# Patient Record
Sex: Female | Born: 1965 | Race: White | Hispanic: No | Marital: Married | Smoking: Current some day smoker
Health system: Southern US, Community
[De-identification: ages and names within clinical notes are randomized; demographics above are authoritative.]

## PROBLEM LIST (undated history)

## (undated) DIAGNOSIS — I10 Essential (primary) hypertension: Secondary | ICD-10-CM

## (undated) DIAGNOSIS — M47812 Spondylosis without myelopathy or radiculopathy, cervical region: Secondary | ICD-10-CM

## (undated) HISTORY — DX: Spondylosis without myelopathy or radiculopathy, cervical region: M47.812

## (undated) HISTORY — PX: WISDOM TOOTH EXTRACTION: SHX21

## (undated) HISTORY — PX: DILATION AND CURETTAGE, DIAGNOSTIC / THERAPEUTIC: SUR384

## (undated) HISTORY — PX: BREAST BIOPSY: SHX20

## (undated) HISTORY — DX: Essential (primary) hypertension: I10

---
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
## 1989-10-21 HISTORY — PX: SPINAL FUSION: SHX223

## 2012-03-25 HISTORY — PX: LAPAROSCOPIC GASTRIC BANDING WITH HIATAL HERNIA REPAIR: SHX6351

## 2012-11-22 HISTORY — PX: CYSTOPLASTY: SHX475

## 2018-08-11 ENCOUNTER — Encounter: Payer: Self-pay | Admitting: Orthopaedic Surgery

## 2018-08-11 HISTORY — PX: HIP ARTHROPLASTY: SHX981

## 2020-03-29 ENCOUNTER — Ambulatory Visit: Payer: No Typology Code available for payment source | Admitting: Orthopaedic Surgery

## 2020-03-29 ENCOUNTER — Other Ambulatory Visit: Payer: Self-pay

## 2020-04-04 ENCOUNTER — Other Ambulatory Visit: Payer: Self-pay

## 2020-04-04 ENCOUNTER — Encounter: Payer: Self-pay | Admitting: Orthopaedic Surgery

## 2020-04-04 ENCOUNTER — Ambulatory Visit (INDEPENDENT_AMBULATORY_CARE_PROVIDER_SITE_OTHER): Payer: No Typology Code available for payment source | Admitting: Orthopaedic Surgery

## 2020-04-04 ENCOUNTER — Ambulatory Visit: Payer: No Typology Code available for payment source

## 2020-04-04 VITALS — BP 120/71 | HR 74 | Ht 61.0 in | Wt 144.0 lb

## 2020-04-04 DIAGNOSIS — M25552 Pain in left hip: Secondary | ICD-10-CM | POA: Diagnosis not present

## 2020-04-04 DIAGNOSIS — Z96642 Presence of left artificial hip joint: Secondary | ICD-10-CM | POA: Diagnosis not present

## 2020-04-04 DIAGNOSIS — G8929 Other chronic pain: Secondary | ICD-10-CM

## 2020-04-04 MED ORDER — IBUPROFEN 800 MG PO TABS
800.0000 mg | ORAL_TABLET | Freq: Three times a day (TID) | ORAL | 5 refills | Status: AC | PRN
Start: 2020-04-04 — End: ?

## 2020-04-04 MED ORDER — HYDROCODONE-ACETAMINOPHEN 5-325 MG PO TABS: ORAL_TABLET | ORAL | 0 refills | Status: DC
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
## 2020-04-26 ENCOUNTER — Other Ambulatory Visit: Payer: Self-pay

## 2020-04-26 ENCOUNTER — Ambulatory Visit: Payer: No Typology Code available for payment source | Admitting: Orthopaedic Surgery

## 2020-04-26 ENCOUNTER — Encounter (HOSPITAL_COMMUNITY): Payer: Self-pay

## 2020-04-26 ENCOUNTER — Ambulatory Visit (HOSPITAL_COMMUNITY)
Admission: RE | Admit: 2020-04-26 | Discharge: 2020-04-26 | Disposition: A | Discharge status: Home / Self Care | Payer: No Typology Code available for payment source | Source: Ambulatory Visit | Attending: Orthopaedic Surgery | Admitting: Orthopaedic Surgery

## 2020-04-26 ENCOUNTER — Other Ambulatory Visit (HOSPITAL_COMMUNITY)
Admission: RE | Admit: 2020-04-26 | Discharge: 2020-04-26 | Disposition: A | Discharge status: Home / Self Care | Payer: No Typology Code available for payment source | Source: Ambulatory Visit | Attending: Orthopaedic Surgery | Admitting: Orthopaedic Surgery

## 2020-04-26 DIAGNOSIS — Z96642 Presence of left artificial hip joint: Secondary | ICD-10-CM | POA: Diagnosis not present

## 2020-04-26 LAB — CBC WITH DIFFERENTIAL/PLATELET
Abs Immature Granulocytes: 0.01 10*3/uL (ref 0.00–0.07)
Basophils Absolute: 0 10*3/uL (ref 0.0–0.1)
Basophils Relative: 1 %
Eosinophils Absolute: 0.1 10*3/uL (ref 0.0–0.5)
Eosinophils Relative: 1 %
HCT: 40.2 % (ref 36.0–46.0)
Hemoglobin: 13.6 g/dL (ref 12.0–15.0)
Immature Granulocytes: 0 %
Lymphocytes Relative: 27 %
Lymphs Abs: 1.7 10*3/uL (ref 0.7–4.0)
MCH: 31.1 pg (ref 26.0–34.0)
MCHC: 33.8 g/dL (ref 30.0–36.0)
MCV: 91.8 fL (ref 80.0–100.0)
Monocytes Absolute: 0.5 10*3/uL (ref 0.1–1.0)
Monocytes Relative: 7 %
Neutro Abs: 4.2 10*3/uL (ref 1.7–7.7)
Neutrophils Relative %: 64 %
Platelets: 275 10*3/uL (ref 150–400)
RBC: 4.38 MIL/uL (ref 3.87–5.11)
RDW: 11.9 % (ref 11.5–15.5)
WBC: 6.5 10*3/uL (ref 4.0–10.5)
nRBC: 0 % (ref 0.0–0.2)

## 2020-04-26 LAB — C-REACTIVE PROTEIN: CRP: 0.7 mg/dL

## 2020-04-26 LAB — SEDIMENTATION RATE: Sed Rate: 1 mm/hr (ref 0–22)

## 2020-05-10 ENCOUNTER — Ambulatory Visit: Payer: No Typology Code available for payment source | Admitting: Orthopaedic Surgery

## 2020-05-11 ENCOUNTER — Ambulatory Visit (INDEPENDENT_AMBULATORY_CARE_PROVIDER_SITE_OTHER): Payer: No Typology Code available for payment source | Admitting: Orthopaedic Surgery

## 2020-05-11 ENCOUNTER — Encounter: Payer: Self-pay | Admitting: Orthopaedic Surgery

## 2020-05-11 ENCOUNTER — Other Ambulatory Visit: Payer: Self-pay

## 2020-05-11 ENCOUNTER — Telehealth: Payer: Self-pay

## 2020-05-11 VITALS — BP 124/85 | HR 81 | Ht 61.0 in | Wt 144.0 lb

## 2020-05-11 DIAGNOSIS — G8929 Other chronic pain: Secondary | ICD-10-CM | POA: Diagnosis not present

## 2020-05-11 DIAGNOSIS — M25552 Pain in left hip: Secondary | ICD-10-CM

## 2020-05-11 DIAGNOSIS — Z96642 Presence of left artificial hip joint: Secondary | ICD-10-CM | POA: Diagnosis not present

## 2020-05-11 MED ORDER — HYDROCODONE-ACETAMINOPHEN 5-325 MG PO TABS: ORAL_TABLET | ORAL | 0 refills | Status: AC

## 2020-05-11 NOTE — Progress Notes (Signed)
Patient Pamela Barron, female DOB:September 24, 1966, 54 y.o. TSV:[id number]  Chief Complaint  Patient presents with  . Hip Pain    Left chronic hip pain.    HPI  Pamela Barron is a 54 y.o. female who has a painful left total hip replacement.  I had labs done which are normal.  She had a CT done of the hip and it showed: IMPRESSION: Normal appearing left hip arthroplasty. No finding to explain the patient's symptoms.  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  She has pain still in the hip.  I will have her see Dr. Turner Daniels for evaluation of her painful total hip.  She is agreeable to this.     Body mass index is 27.21 kg/m.  ROS  Review of Systems  Constitutional: Positive for activity change.  Musculoskeletal: Positive for arthralgias and gait problem.  All other systems reviewed and are negative.   All other systems reviewed and are negative.  The following is a summary of the past history medically, past history surgically, known current medicines, social history and family history.  This information is gathered electronically by the computer from prior information and documentation.  I review this each visit and have found including this information at this point in the chart is beneficial and informative.    Past Medical History:  Diagnosis Date  . Osteoarthritis of cervical and lumbar spine     Past Surgical History:  Procedure Laterality Date  . BREAST BIOPSY    . CYSTOPLASTY  11/22/2012  . DILATION AND CURETTAGE, DIAGNOSTIC / THERAPEUTIC     x2  . HIP ARTHROPLASTY Left 08/11/2018  . LAPAROSCOPIC GASTRIC BANDING WITH HIATAL HERNIA REPAIR  03/25/2012  . SPINAL FUSION  1991   C5-C6, C6-C7  . TONSILLECTOMY  1972  . WISDOM TOOTH EXTRACTION      History reviewed. No pertinent family history.  Social History Social History   Tobacco Use  . Smoking status: Current Some Day Smoker  . Smokeless tobacco: Never Used  Substance Use Topics   . Alcohol use: Not on file  . Drug use: Not on file    Allergies  Allergen Reactions  . Sulfa Antibiotics Swelling    Current Outpatient Medications  Medication Sig Dispense Refill  . celecoxib (CELEBREX) 200 MG capsule Take 200 mg by mouth daily.    Marland Kitchen HYDROcodone-acetaminophen (NORCO/VICODIN) 5-325 MG tablet One tablet every four hours as needed for acute pain.  Limit of five days per [place] statue. 30 tablet 0  . ibuprofen (ADVIL) 800 MG tablet Take 1 tablet (800 mg total) by mouth every 8 (eight) hours as needed. 90 tablet 5   No current facility-administered medications for this visit.     Physical Exam  Blood pressure 124/85, pulse 81, height 5\' 1"  (1.549 m), weight 144 lb (65.3 kg).  Constitutional: overall normal hygiene, normal nutrition, well developed, normal grooming, normal body habitus. Assistive device:none  Musculoskeletal: gait and station Limp left, muscle tone and strength are normal, no tremors or atrophy is present.  .  Neurological: coordination overall normal.  Deep tendon reflex/nerve stretch intact.  Sensation normal.  Cranial nerves II-XII intact.   Skin:   Normal overall no scars, lesions, ulcers or rashes. No psoriasis.  Psychiatric: Alert and oriented x 3.  Recent memory intact, remote memory unclear.  Normal mood and affect. Well groomed.  Good eye contact.  Cardiovascular: overall no swelling, no varicosities, no edema  bilaterally, normal temperatures of the legs and arms, no clubbing, cyanosis and good capillary refill.  Lymphatic: palpation is normal.  Left hip with good ROM.  NV intact.  All other systems reviewed and are negative   The patient has been educated about the nature of the problem(s) and counseled on treatment options.  The patient appeared to understand what I have discussed and is in agreement with it.  Encounter Diagnosis  Name Primary?  . 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  Precautions discussed.  Continue current medications.   Return to clinic to Dr. Turner Daniels for evaluation of the painful hip, possible revision may be needed.   Electronically Signed Darreld Mclean, MD 7/22/202111:29 AM

## 2020-05-11 NOTE — Telephone Encounter (Signed)
Hydrocodone-Acetaminophen  5/325 mg  Qty 30 Tablets °

## 2020-06-09 ENCOUNTER — Other Ambulatory Visit (HOSPITAL_COMMUNITY): Payer: Self-pay | Admitting: Orthopedic Surgery

## 2020-06-09 ENCOUNTER — Other Ambulatory Visit: Payer: Self-pay | Admitting: Orthopedic Surgery

## 2020-06-09 DIAGNOSIS — M25552 Pain in left hip: Secondary | ICD-10-CM

## 2020-06-09 DIAGNOSIS — Z96642 Presence of left artificial hip joint: Secondary | ICD-10-CM

## 2020-06-22 ENCOUNTER — Encounter (HOSPITAL_COMMUNITY)
Admission: RE | Admit: 2020-06-22 | Discharge: 2020-06-22 | Disposition: A | Discharge status: Home / Self Care | Payer: No Typology Code available for payment source | Source: Ambulatory Visit | Attending: Orthopedic Surgery | Admitting: Orthopedic Surgery

## 2020-06-22 ENCOUNTER — Other Ambulatory Visit: Payer: Self-pay

## 2020-06-22 ENCOUNTER — Encounter (HOSPITAL_COMMUNITY): Payer: Self-pay

## 2020-06-22 DIAGNOSIS — Z96642 Presence of left artificial hip joint: Secondary | ICD-10-CM | POA: Diagnosis present

## 2020-06-22 DIAGNOSIS — M25552 Pain in left hip: Secondary | ICD-10-CM | POA: Diagnosis not present

## 2020-06-22 MED ORDER — TECHNETIUM TC 99M MEDRONATE IV KIT
20.0000 | PACK | Freq: Once | INTRAVENOUS | Status: AC | PRN
  Administered 2020-06-22: 20.5 via INTRAVENOUS

## 2020-08-17 ENCOUNTER — Other Ambulatory Visit (HOSPITAL_COMMUNITY): Payer: Self-pay | Admitting: Orthopedic Surgery

## 2020-08-17 DIAGNOSIS — Z96642 Presence of left artificial hip joint: Secondary | ICD-10-CM

## 2020-08-17 DIAGNOSIS — M25552 Pain in left hip: Secondary | ICD-10-CM

## 2020-09-26 ENCOUNTER — Ambulatory Visit (HOSPITAL_COMMUNITY): Payer: No Typology Code available for payment source

## 2020-10-10 ENCOUNTER — Ambulatory Visit (HOSPITAL_COMMUNITY)
Admission: RE | Admit: 2020-10-10 | Discharge: 2020-10-10 | Disposition: A | Discharge status: Home / Self Care | Payer: No Typology Code available for payment source | Source: Ambulatory Visit | Attending: Orthopedic Surgery | Admitting: Orthopedic Surgery

## 2020-10-10 ENCOUNTER — Other Ambulatory Visit: Payer: Self-pay

## 2020-10-10 DIAGNOSIS — M25552 Pain in left hip: Secondary | ICD-10-CM | POA: Diagnosis present

## 2020-10-10 DIAGNOSIS — Z96642 Presence of left artificial hip joint: Secondary | ICD-10-CM | POA: Diagnosis present

## 2021-11-17 IMAGING — CT CT HIP*L* W/O CM
2 of 4 series · 16 of 46 positions shown, 19 images · non-contrast
Comparison: Plain films left hip 04/04/2020.

CLINICAL DATA: Patient status post left hip replacement in 2664.
Sensation of slipping and instability of the hip. Question
loosening. No known injury.

EXAM:
CT OF THE LEFT HIP WITHOUT CONTRAST
TECHNIQUE: Multidetector CT imaging of the left hip was performed according to
the standard protocol. Multiplanar CT image reconstructions were
also generated.

[Series 5: 3 axial soft · axial · 0.42mm/px · z∈[+806,+1016]mm · 13 of 117 slices shown, 16 images]
[im 8/117  soft-tissue]
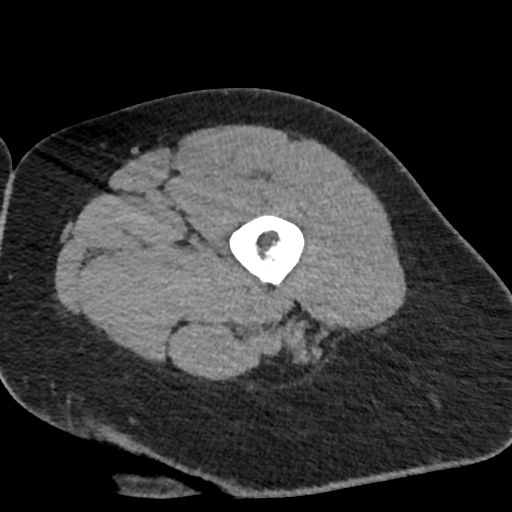
[im 8/117  bone]
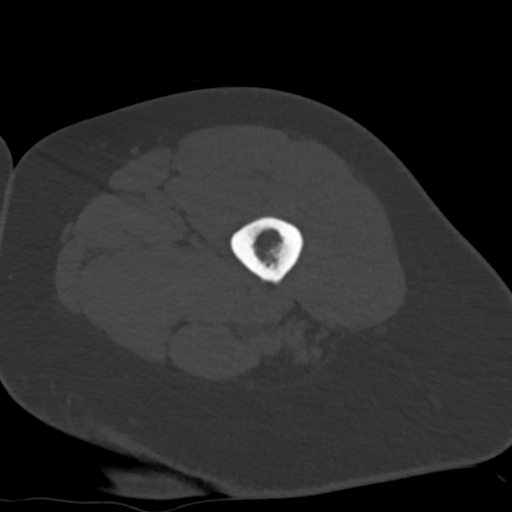
[im 19/117  soft-tissue]
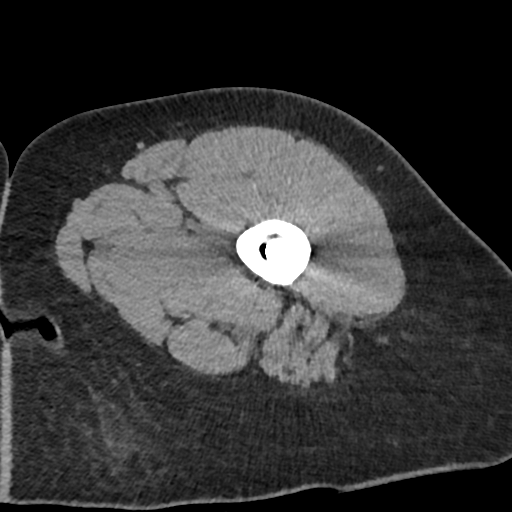
[im 30/117  soft-tissue]
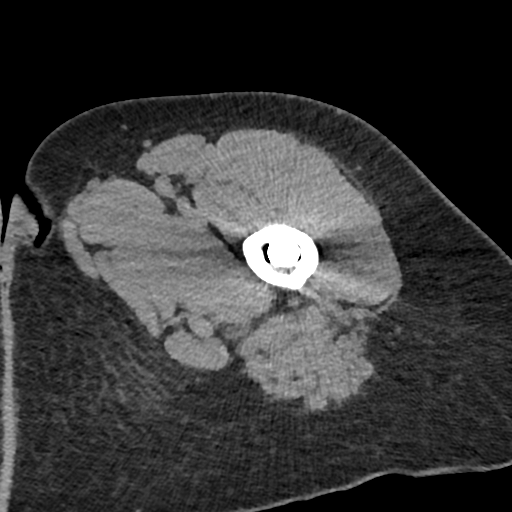
[im 42/117  soft-tissue]
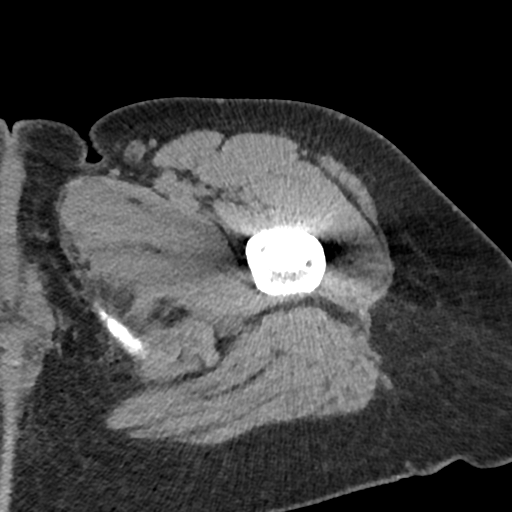
[im 53/117  soft-tissue]
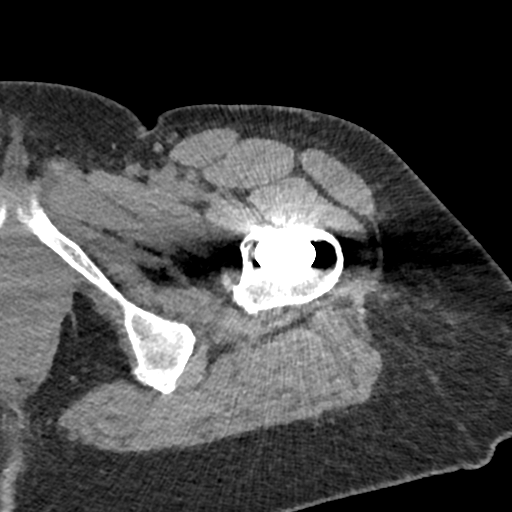
[im 64/117  soft-tissue]
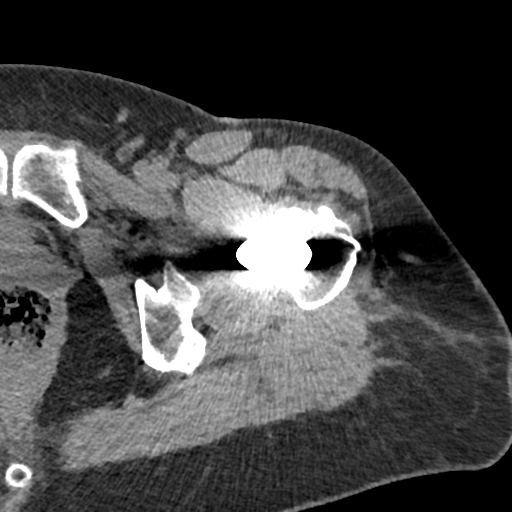
[im 75/117  soft-tissue]
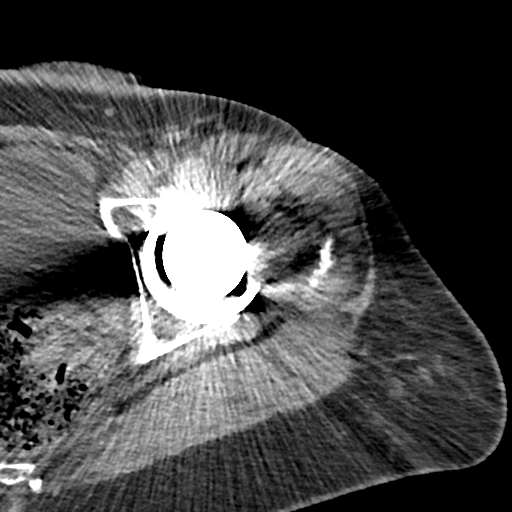
[im 87/117  soft-tissue]
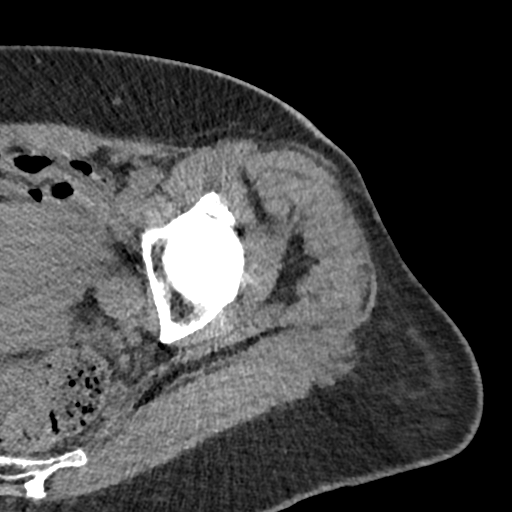
[im 98/117  soft-tissue]
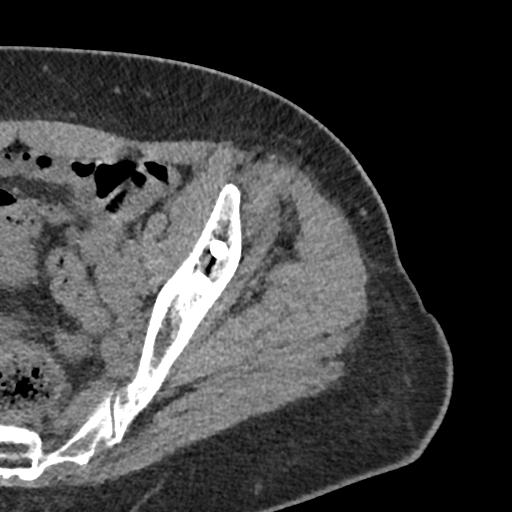
[im 98/117  bone]
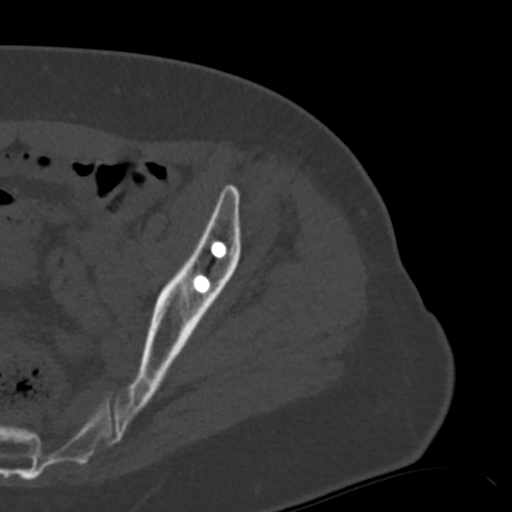
[im 102/117  lung]
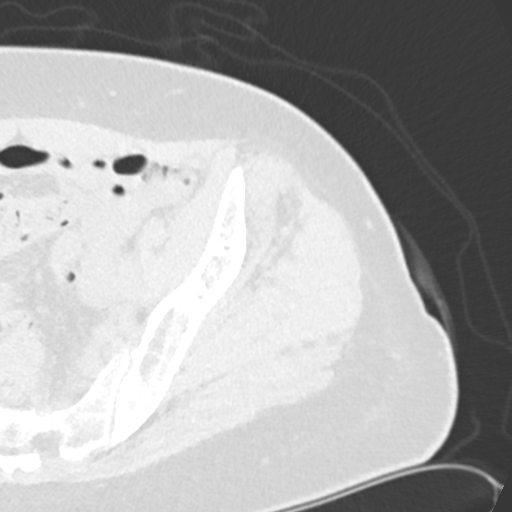
[im 105/117  lung]
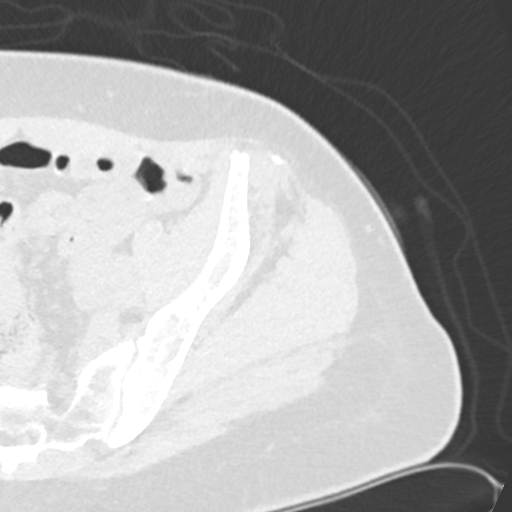
[im 109/117  soft-tissue]
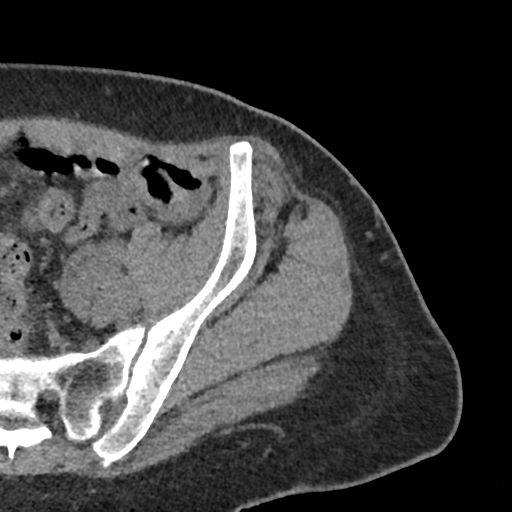
[im 109/117  lung]
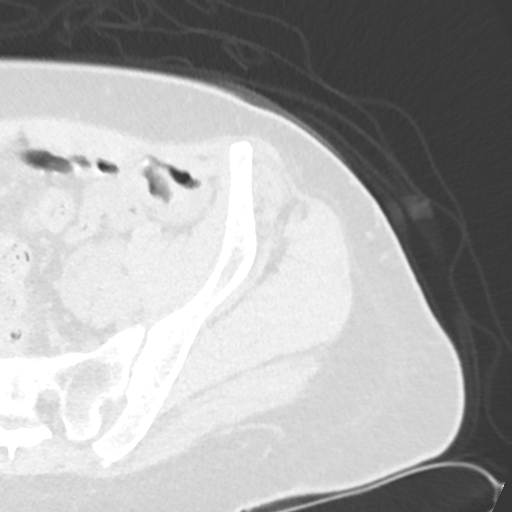
[im 113/117  lung]
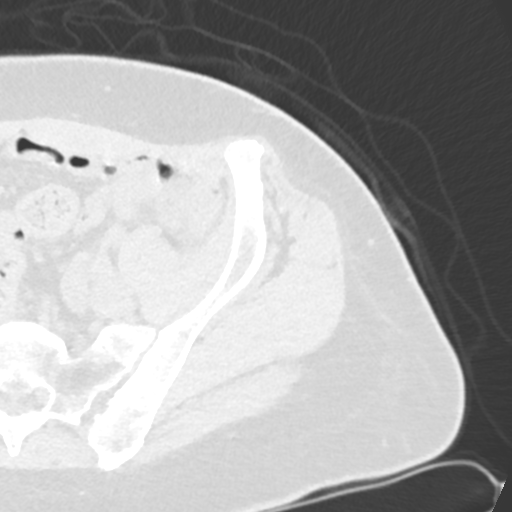

[Series 8: coronal st · coronal · 0.41mm/px · 3 of 114 slices shown]
[im 38/114  soft-tissue]
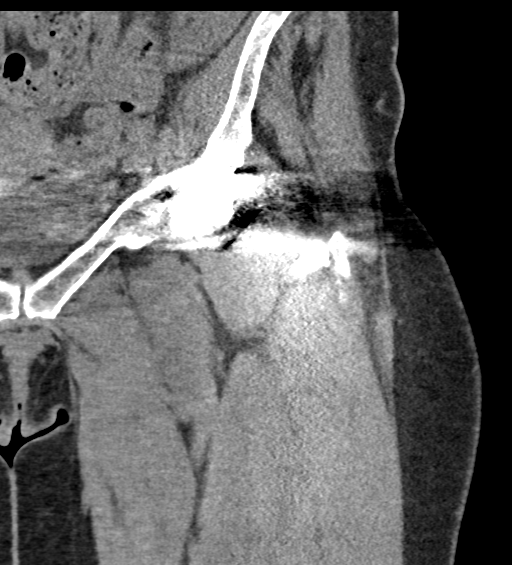
[im 51/114  soft-tissue]
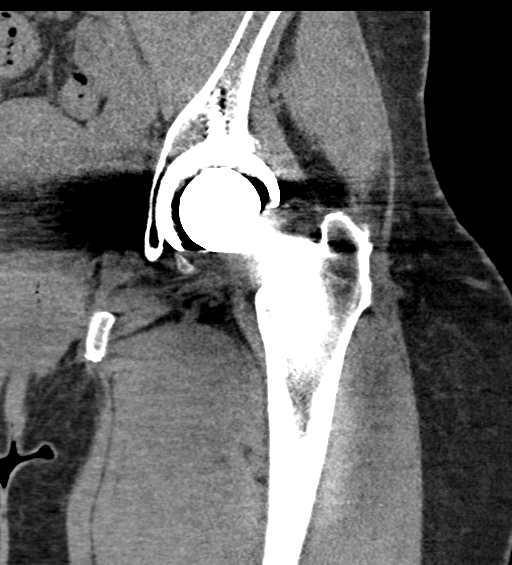
[im 63/114  soft-tissue]
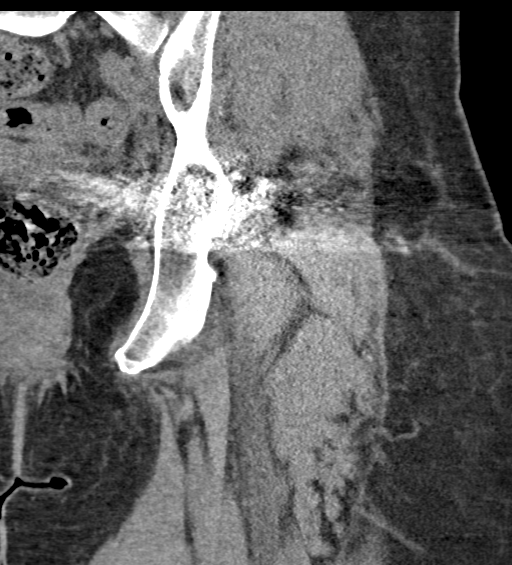

[16 of 46 positions shown; findings below may reference images not displayed]

FINDINGS: Bones/Joint/Cartilage

Total left hip replacement is in place. The device is located. No
evidence of loosening is seen. The femoral head is centered in the
acetabular cup. No periprosthetic fracture or evidence of stress
change is identified. No fluid collection is seen about the device.
No acute bony or joint abnormality is identified. There is a small
cortical defect in the anterior margin of the ileum seen on the
first image only which is likely congenital or could be due to
remote trauma.

Ligaments

Suboptimally assessed by CT.

Muscles and Tendons

Intact and normal in appearance.

Soft tissues

Normal. No fluid collection or mass is identified. Imaged
intrapelvic contents are unremarkable.
IMPRESSION: Normal appearing left hip arthroplasty. No finding to explain the
patient's symptoms.
